# Patient Record
Sex: Female | Born: 1975 | Race: White | Hispanic: No | Smoking: Former smoker
Health system: Southern US, Community
[De-identification: ages and names within clinical notes are randomized; demographics above are authoritative.]

## PROBLEM LIST (undated history)

## (undated) DIAGNOSIS — I10 Essential (primary) hypertension: Secondary | ICD-10-CM

## (undated) DIAGNOSIS — E119 Type 2 diabetes mellitus without complications: Secondary | ICD-10-CM

## (undated) DIAGNOSIS — E785 Hyperlipidemia, unspecified: Secondary | ICD-10-CM

## (undated) DIAGNOSIS — J45909 Unspecified asthma, uncomplicated: Secondary | ICD-10-CM

## (undated) HISTORY — PX: CARPAL TUNNEL RELEASE: SHX101

## (undated) HISTORY — PX: APPENDECTOMY: SHX54

## (undated) HISTORY — PX: TONSILLECTOMY: SUR1361

## (undated) HISTORY — PX: TUBAL LIGATION: SHX77

---

## 2018-06-25 ENCOUNTER — Emergency Department (HOSPITAL_COMMUNITY): Payer: Medicaid - Out of State

## 2018-06-25 ENCOUNTER — Encounter (HOSPITAL_COMMUNITY): Payer: Self-pay | Admitting: Emergency Medicine

## 2018-06-25 ENCOUNTER — Emergency Department (HOSPITAL_COMMUNITY)
Admission: EM | Admit: 2018-06-25 | Discharge: 2018-06-25 | Disposition: A | Discharge status: Home / Self Care | Payer: Medicaid - Out of State | Attending: Emergency Medicine | Admitting: Emergency Medicine

## 2018-06-25 ENCOUNTER — Other Ambulatory Visit: Payer: Self-pay

## 2018-06-25 DIAGNOSIS — E119 Type 2 diabetes mellitus without complications: Secondary | ICD-10-CM | POA: Insufficient documentation

## 2018-06-25 DIAGNOSIS — I10 Essential (primary) hypertension: Secondary | ICD-10-CM | POA: Insufficient documentation

## 2018-06-25 DIAGNOSIS — Z91040 Latex allergy status: Secondary | ICD-10-CM | POA: Diagnosis not present

## 2018-06-25 DIAGNOSIS — M79642 Pain in left hand: Secondary | ICD-10-CM | POA: Diagnosis not present

## 2018-06-25 HISTORY — DX: Type 2 diabetes mellitus without complications: E11.9

## 2018-06-25 HISTORY — DX: Essential (primary) hypertension: I10

## 2018-06-25 MED ORDER — KETOROLAC TROMETHAMINE 30 MG/ML IJ SOLN
30.0000 mg | Freq: Once | INTRAMUSCULAR | Status: AC
  Administered 2018-06-25: 30 mg via INTRAMUSCULAR
  Filled 2018-06-25: qty 1

## 2018-06-25 MED ORDER — NAPROXEN 500 MG PO TABS: 500.0000 mg | ORAL_TABLET | Freq: Two times a day (BID) | ORAL | 0 refills | Status: AC

## 2018-06-25 NOTE — Discharge Instructions (Signed)
I have provided antiinflammatories to help with you hand swelling please take one tablet twice a day for the next 7 days. Please follow up with your PCP as needed.If you experience any fever, worsening symptoms or swelling worsen return to the ED for reevaluation.

## 2018-06-25 NOTE — ED Triage Notes (Signed)
Pt c/o LT hand pain and swelling since Thursday. Denies injury.

## 2018-06-25 NOTE — ED Notes (Signed)
EDP at bedside  

## 2018-06-25 NOTE — ED Provider Notes (Signed)
[facility] EMERGENCY DEPARTMENT Provider Note   CSN: [id number] Arrival date & time: 06/25/18  1058     History   Chief Complaint Chief Complaint  Patient presents with  . Hand Pain    HPI Emily Fritz is a 42 y.o. female.  42 y/o female with a PMH of DM, HTN presents to the ED with a chief complaint of left hand pain x 12 hours. Patient reports pain at the base of her wrist and finger. The pain is worse with hand flexion and extension. Patient has taken 800 mg ibuprofen for her pain but state no relieve in symptoms. She denies any trauma, fever, previous history of gout or other complaints.      Past Medical History:  Diagnosis Date  . Diabetes mellitus without complication (HCC)   . Hypertension     There are no active problems to display for this patient.   Past Surgical History:  Procedure Laterality Date  . APPENDECTOMY    . CARPAL TUNNEL RELEASE    . CESAREAN SECTION    . TONSILLECTOMY    . TUBAL LIGATION       OB History   None      Home Medications    Prior to Admission medications   Medication Sig Start Date End Date Taking? Authorizing Provider  naproxen (NAPROSYN) 500 MG tablet Take 1 tablet (500 mg total) by mouth 2 (two) times daily for 7 days. 06/25/18 07/02/18  Claude Manges, PA-C    Family History No family history on file.  Social History Social History   Tobacco Use  . Smoking status: Never Smoker  . Smokeless tobacco: Never Used  Substance Use Topics  . Alcohol use: Not Currently  . Drug use: Never     Allergies   Clindamycin/lincomycin; Amoxicillin; Latex; and Tramadol   Review of Systems Review of Systems  Constitutional: Negative for fever.  Musculoskeletal: Positive for arthralgias.  Skin: Negative for color change, pallor, rash and wound.     Physical Exam Updated Vital Signs BP (!) 126/59 (BP Location: Right Arm)   Pulse 81   Temp 97.9 F (36.6 C) (Oral)   Resp 16   Ht 5\' 3"  (1.6 m)   Wt 122.5 kg   LMP  02/08/2018   SpO2 99%   BMI 47.83 kg/m   Physical Exam  Constitutional: She is oriented to person, place, and time. She appears well-developed and well-nourished.  HENT:  Head: Normocephalic and atraumatic.  Neck: Normal range of motion. Neck supple.  Cardiovascular: Normal heart sounds.  Pulmonary/Chest: Breath sounds normal.  Abdominal: Soft.  Musculoskeletal: She exhibits tenderness. She exhibits no edema or deformity.       Right wrist: She exhibits tenderness and swelling. She exhibits no effusion, no crepitus, no deformity and no laceration.       Left hand: She exhibits tenderness. She exhibits no bony tenderness, normal capillary refill, no deformity, no laceration and no swelling. Normal sensation noted. Decreased sensation is not present in the ulnar distribution, is not present in the medial redistribution and is not present in the radial distribution. Normal strength noted. She exhibits no finger abduction, no thumb/finger opposition and no wrist extension trouble.       Hands: Pulses present, capillary refill intact. Neurologically intact.   Neurological: She is alert and oriented to person, place, and time.  Skin: Skin is warm and dry.  Nursing note and vitals reviewed.    ED Treatments / Results  Labs (all labs  ordered are listed, but only abnormal results are displayed) Labs Reviewed - No data to display  EKG None  Radiology Dg Hand Complete Left  Result Date: 06/25/2018 CLINICAL DATA:  42 year old female with left hand pain and swelling for 3 days but no known injury. EXAM: LEFT HAND - COMPLETE 3+ VIEW COMPARISON:  None. FINDINGS: Bone mineralization is within normal limits. There is no evidence of fracture or dislocation. There is no evidence of arthropathy or other focal bone abnormality. Appearance of generalized soft tissue swelling, no soft tissue gas or discrete soft tissue injury. IMPRESSION: Soft tissue swelling with no osseous abnormality identified.  Electronically Signed   By: Odessa Fleming M.D.   On: 06/25/2018 12:20    Procedures Procedures (including critical care time)  Medications Ordered in ED Medications  ketorolac (TORADOL) 30 MG/ML injection 30 mg (30 mg Intramuscular Given 06/25/18 1322)     Initial Impression / Assessment and Plan / ED Course  I have reviewed the triage vital signs and the nursing notes.  Pertinent labs & imaging results that were available during my care of the patient were reviewed by me and considered in my medical decision making (see chart for details).    Patient presents with left hand pain x 12 hours. DG left hand showed no fracture, dislocation. Soft tissue swelling noted.  Time I will provide patient with a wrist splint in order to help with her swelling.  She will also receive Toradol IM for her pain in the ED as patient is teary-eyed up on arrival.  I will send patient home with naproxen 500 mg twice a day and to follow-up with orthopedist if her symptoms do not improve.  Patient does not have a history of gout, and her pain is mostly on her hand distribution not the wrist joint.  She denies any fevers, less likely for a septic joint.  Patient is advised to follow-up with her PCP in 1 week for reevaluation of symptoms.  Patient understands and agrees with management.  Return precautions discussed.    Final Clinical Impressions(s) / ED Diagnoses   Final diagnoses:  Left hand pain    ED Discharge Orders         Ordered    naproxen (NAPROSYN) 500 MG tablet  2 times daily     06/25/18 1307           Claude Manges, PA-C 06/25/18 1334    Vanetta Mulders, MD 06/25/18 1740

## 2018-08-16 ENCOUNTER — Other Ambulatory Visit: Payer: Self-pay

## 2018-08-16 ENCOUNTER — Emergency Department (HOSPITAL_COMMUNITY): Payer: Medicaid - Out of State

## 2018-08-16 ENCOUNTER — Encounter (HOSPITAL_COMMUNITY): Payer: Self-pay | Admitting: Emergency Medicine

## 2018-08-16 ENCOUNTER — Emergency Department (HOSPITAL_COMMUNITY)
Admission: EM | Admit: 2018-08-16 | Discharge: 2018-08-16 | Disposition: A | Discharge status: Home / Self Care | Payer: Medicaid - Out of State | Attending: Emergency Medicine | Admitting: Emergency Medicine

## 2018-08-16 DIAGNOSIS — Z79899 Other long term (current) drug therapy: Secondary | ICD-10-CM | POA: Insufficient documentation

## 2018-08-16 DIAGNOSIS — I10 Essential (primary) hypertension: Secondary | ICD-10-CM | POA: Diagnosis not present

## 2018-08-16 DIAGNOSIS — J45901 Unspecified asthma with (acute) exacerbation: Secondary | ICD-10-CM | POA: Diagnosis not present

## 2018-08-16 DIAGNOSIS — E119 Type 2 diabetes mellitus without complications: Secondary | ICD-10-CM | POA: Diagnosis not present

## 2018-08-16 DIAGNOSIS — R062 Wheezing: Secondary | ICD-10-CM | POA: Diagnosis present

## 2018-08-16 HISTORY — DX: Unspecified asthma, uncomplicated: J45.909

## 2018-08-16 LAB — I-STAT CHEM 8, ED
BUN: 12 mg/dL (ref 6–20)
CHLORIDE: 103 mmol/L (ref 98–111)
CREATININE: 0.7 mg/dL (ref 0.44–1.00)
Calcium, Ion: 0.9 mmol/L — ABNORMAL LOW (ref 1.15–1.40)
GLUCOSE: 130 mg/dL — AB (ref 70–99)
HEMATOCRIT: 41 % (ref 36.0–46.0)
Hemoglobin: 13.9 g/dL (ref 12.0–15.0)
POTASSIUM: 4.3 mmol/L (ref 3.5–5.1)
Sodium: 135 mmol/L (ref 135–145)
TCO2: 29 mmol/L (ref 22–32)

## 2018-08-16 LAB — I-STAT BETA HCG BLOOD, ED (MC, WL, AP ONLY): I-stat hCG, quantitative: <5 m[IU]/mL (ref <5)

## 2018-08-16 LAB — CBG MONITORING, ED: GLUCOSE-CAPILLARY: 232 mg/dL — AB (ref 70–99)

## 2018-08-16 MED ORDER — PREDNISONE 20 MG PO TABS: 40.0000 mg | ORAL_TABLET | Freq: Every day | ORAL | 0 refills | Status: AC

## 2018-08-16 MED ORDER — SODIUM CHLORIDE 0.9 % IV BOLUS
1000.0000 mL | Freq: Once | INTRAVENOUS | Status: AC
  Administered 2018-08-16: 1000 mL via INTRAVENOUS

## 2018-08-16 MED ORDER — PREDNISONE 50 MG PO TABS
60.0000 mg | ORAL_TABLET | Freq: Once | ORAL | Status: AC
  Administered 2018-08-16: 60 mg via ORAL
  Filled 2018-08-16: qty 1

## 2018-08-16 MED ORDER — ALBUTEROL SULFATE (2.5 MG/3ML) 0.083% IN NEBU
2.5000 mg | INHALATION_SOLUTION | Freq: Once | RESPIRATORY_TRACT | Status: AC
  Administered 2018-08-16: 2.5 mg via RESPIRATORY_TRACT
  Filled 2018-08-16: qty 3

## 2018-08-16 MED ORDER — AZITHROMYCIN 250 MG PO TABS: ORAL_TABLET | ORAL | 0 refills | Status: AC

## 2018-08-16 MED ORDER — IPRATROPIUM-ALBUTEROL 0.5-2.5 (3) MG/3ML IN SOLN
3.0000 mL | Freq: Once | RESPIRATORY_TRACT | Status: AC
  Administered 2018-08-16: 3 mL via RESPIRATORY_TRACT
  Filled 2018-08-16: qty 3

## 2018-08-16 MED ORDER — ALBUTEROL (5 MG/ML) CONTINUOUS INHALATION SOLN
10.0000 mg/h | INHALATION_SOLUTION | Freq: Once | RESPIRATORY_TRACT | Status: AC
  Administered 2018-08-16: 10 mg/h via RESPIRATORY_TRACT
  Filled 2018-08-16: qty 20

## 2018-08-16 MED ORDER — ALBUTEROL SULFATE HFA 108 (90 BASE) MCG/ACT IN AERS
2.0000 | INHALATION_SPRAY | Freq: Once | RESPIRATORY_TRACT | Status: AC
  Administered 2018-08-16: 2 via RESPIRATORY_TRACT
  Filled 2018-08-16: qty 6.7

## 2018-08-16 NOTE — Discharge Instructions (Addendum)
Take the medicines as prescribed taking your next dose of the prednisone tomorrow morning.  Use your albuterol inhaler 2 puffs every 4 hours if you are coughing or wheezing.  Get rechecked immediately for any worsened symptoms.  Take the entire course of the antibiotic prescribed.  Make sure you are checking your blood glucose levels as prednisone can temporarily increase your blood glucose.

## 2018-08-16 NOTE — ED Triage Notes (Signed)
Pt c/o of productive cough with chills x 3 days.  Mild wheezing noted

## 2018-08-16 NOTE — ED Provider Notes (Signed)
[facility] EMERGENCY DEPARTMENT Provider Note   CSN: [id number] Arrival date & time: 08/16/18  9562     History   Chief Complaint Chief Complaint  Patient presents with  . Cough    HPI Emily Fritz is a 42 y.o. female with a history of asthma, diabetes and hypertension presenting with a 3-day history of wheezing, shortness of breath, cough which is been productive of clear sputum in association with posttussive emesis and chills with subjective fever.  She has had no upper respiratory symptoms including no nasal congestion, rhinorrhea, sore throat.  She also denies nausea or abdominal pain.  She denies chest pain but feels a tightness with her breathing.  She has had significant wheezing with shortness of breath.  Her last albuterol treatment per MDI was just prior to arrival which she states gives transient relief.  The history is provided by the patient.    Past Medical History:  Diagnosis Date  . Asthma   . Diabetes mellitus without complication (HCC)   . Hypertension     There are no active problems to display for this patient.   Past Surgical History:  Procedure Laterality Date  . APPENDECTOMY    . CARPAL TUNNEL RELEASE    . CESAREAN SECTION    . TONSILLECTOMY    . TUBAL LIGATION       OB History   No obstetric history on file.      Home Medications    Prior to Admission medications   Medication Sig Start Date End Date Taking? Authorizing Provider  Cyanocobalamin (VITAMIN B-12 CR) 1000 MCG TBCR Take 1 tablet by mouth daily. 07/31/18  Yes [provider]  ibuprofen (ADVIL,MOTRIN) 200 MG tablet Take 400 mg by mouth every 4 (four) hours as needed for fever or headache.    Yes [provider]  losartan (COZAAR) 50 MG tablet Take 1 tablet by mouth daily. 07/14/18  Yes [provider]  meloxicam (MOBIC) 15 MG tablet Take 1 tablet by mouth daily. 08/01/18  Yes [provider]  metFORMIN (GLUCOPHAGE-XR) 500 MG 24 hr tablet Take  2,000 mg by mouth daily. 05/26/18  Yes [provider]  azithromycin (ZITHROMAX Z-PAK) 250 MG tablet Take 2 tablets by mouth on day one followed by one tablet daily for 4 days. 08/16/18   Burgess Amor, PA-C  predniSONE (DELTASONE) 20 MG tablet Take 2 tablets (40 mg total) by mouth daily for 4 days. 08/16/18 08/20/18  Burgess Amor, PA-C    Family History History reviewed. No pertinent family history.  Social History Social History   Tobacco Use  . Smoking status: Never Smoker  . Smokeless tobacco: Never Used  Substance Use Topics  . Alcohol use: Not Currently  . Drug use: Never     Allergies   Clindamycin/lincomycin; Amoxicillin; Ciprofloxacin; Latex; and Tramadol   Review of Systems Review of Systems  Constitutional: Positive for chills and fever.  HENT: Negative for congestion and sore throat.   Eyes: Negative.   Respiratory: Positive for cough, shortness of breath and wheezing. Negative for chest tightness.   Cardiovascular: Negative for chest pain.  Gastrointestinal: Positive for vomiting. Negative for abdominal pain and nausea.       Posttussive emesis  Genitourinary: Negative.  Negative for decreased urine volume and dysuria.  Musculoskeletal: Negative for arthralgias, joint swelling and neck pain.  Skin: Negative.  Negative for rash and wound.  Neurological: Negative for dizziness, weakness, light-headedness, numbness and headaches.  Psychiatric/Behavioral: Negative.  Physical Exam Updated Vital Signs BP 120/80 (BP Location: Right Arm)   Pulse (!) 103   Temp 98.2 F (36.8 C) (Oral)   Resp 15   Ht 5\' 3"  (1.6 m)   Wt 122.5 kg   LMP 08/13/2018   SpO2 97%   BMI 47.83 kg/m   Physical Exam Vitals signs and nursing note reviewed.  Constitutional:      Appearance: She is well-developed.  HENT:     Head: Normocephalic and atraumatic.  Eyes:     Conjunctiva/sclera: Conjunctivae normal.  Neck:     Musculoskeletal: Normal range of motion.    Cardiovascular:     Rate and Rhythm: Normal rate and regular rhythm.     Heart sounds: Normal heart sounds.  Pulmonary:     Effort: Pulmonary effort is normal.     Breath sounds: Decreased breath sounds and wheezing present. No rhonchi.     Comments: Expiratory wheeze throughout all lung fields with decreased aeration.  Prolonged expirations. Abdominal:     General: Bowel sounds are normal.     Palpations: Abdomen is soft.     Tenderness: There is no abdominal tenderness.  Musculoskeletal: Normal range of motion.  Skin:    General: Skin is warm and dry.  Neurological:     Mental Status: She is alert.      ED Treatments / Results  Labs (all labs ordered are listed, but only abnormal results are displayed) Labs Reviewed  I-STAT CHEM 8, ED - Abnormal; Notable for the following components:      Result Value   Glucose, Bld 130 (*)    Calcium, Ion 0.90 (*)    All other components within normal limits  CBG MONITORING, ED - Abnormal; Notable for the following components:   Glucose-Capillary 232 (*)    All other components within normal limits  I-STAT BETA HCG BLOOD, ED ([facility], [facility], [facility] ONLY)    EKG None  Radiology Dg Chest 2 View  Result Date: 08/16/2018 CLINICAL DATA:  Shortness of breath with cough and wheezing EXAM: CHEST - 2 VIEW COMPARISON:  December 14, 2017 FINDINGS: No edema or consolidation. The heart size and pulmonary vascularity are normal. No adenopathy. No bone lesions. IMPRESSION: No edema or consolidation. Electronically Signed   By: Bretta BangWilliam  Woodruff III M.D.   On: 08/16/2018 10:07    Procedures Procedures (including critical care time)  Medications Ordered in ED Medications  ipratropium-albuterol (DUONEB) 0.5-2.5 (3) MG/3ML nebulizer solution 3 mL (3 mLs Nebulization Given 08/16/18 1010)  albuterol (PROVENTIL) (2.5 MG/3ML) 0.083% nebulizer solution 2.5 mg (2.5 mg Nebulization Given 08/16/18 1010)  predniSONE (DELTASONE) tablet 60 mg (60 mg Oral Given 08/16/18  1026)  albuterol (PROVENTIL,VENTOLIN) solution continuous neb (10 mg/hr Nebulization Given 08/16/18 1230)  sodium chloride 0.9 % bolus 1,000 mL (0 mLs Intravenous Stopped 08/16/18 1355)  albuterol (PROVENTIL HFA;VENTOLIN HFA) 108 (90 Base) MCG/ACT inhaler 2 puff (2 puffs Inhalation Given 08/16/18 1543)     Initial Impression / Assessment and Plan / ED Course  I have reviewed the triage vital signs and the nursing notes.  Pertinent labs & imaging results that were available during my care of the patient were reviewed by me and considered in my medical decision making (see chart for details).     Pt given albuterol atrovent neb tx, prednisone PO with no sig improvement in wheezing or sob.  She was next given albuterol continuous neb over 1 hour with improvement in wheezing, sob greatly improved with improvement in  aeration throughout all lung fields.  She was given an albuterol mdi, stating the mdi she used this am was borrowed as she cannot find hers since a recent move.  Also placed on a pulse dose prednisone taper, caution re blood glucose checks. zithromax started given asthma hx and chills/fever. Return precautions or recheck by pcp discussed if sx persist or worsen.  Final Clinical Impressions(s) / ED Diagnoses   Final diagnoses:  Moderate asthma with exacerbation, unspecified whether persistent    ED Discharge Orders         Ordered    predniSONE (DELTASONE) 20 MG tablet  Daily     08/16/18 1452    azithromycin (ZITHROMAX Z-PAK) 250 MG tablet     08/16/18 1452           Burgess Amor, PA-C 08/16/18 1701    Loren Racer, MD 08/18/18 1101

## 2018-08-16 NOTE — ED Notes (Signed)
Respiratory called about breathing tx

## 2019-03-20 IMAGING — DX DG HAND COMPLETE 3+V*L*
3 series · 3 of 3 positions shown · non-contrast
Comparison: None.

CLINICAL DATA: 42-year-old female with left hand pain and swelling
for 3 days but no known injury.

EXAM:
LEFT HAND - COMPLETE 3+ VIEW

[hand pa]
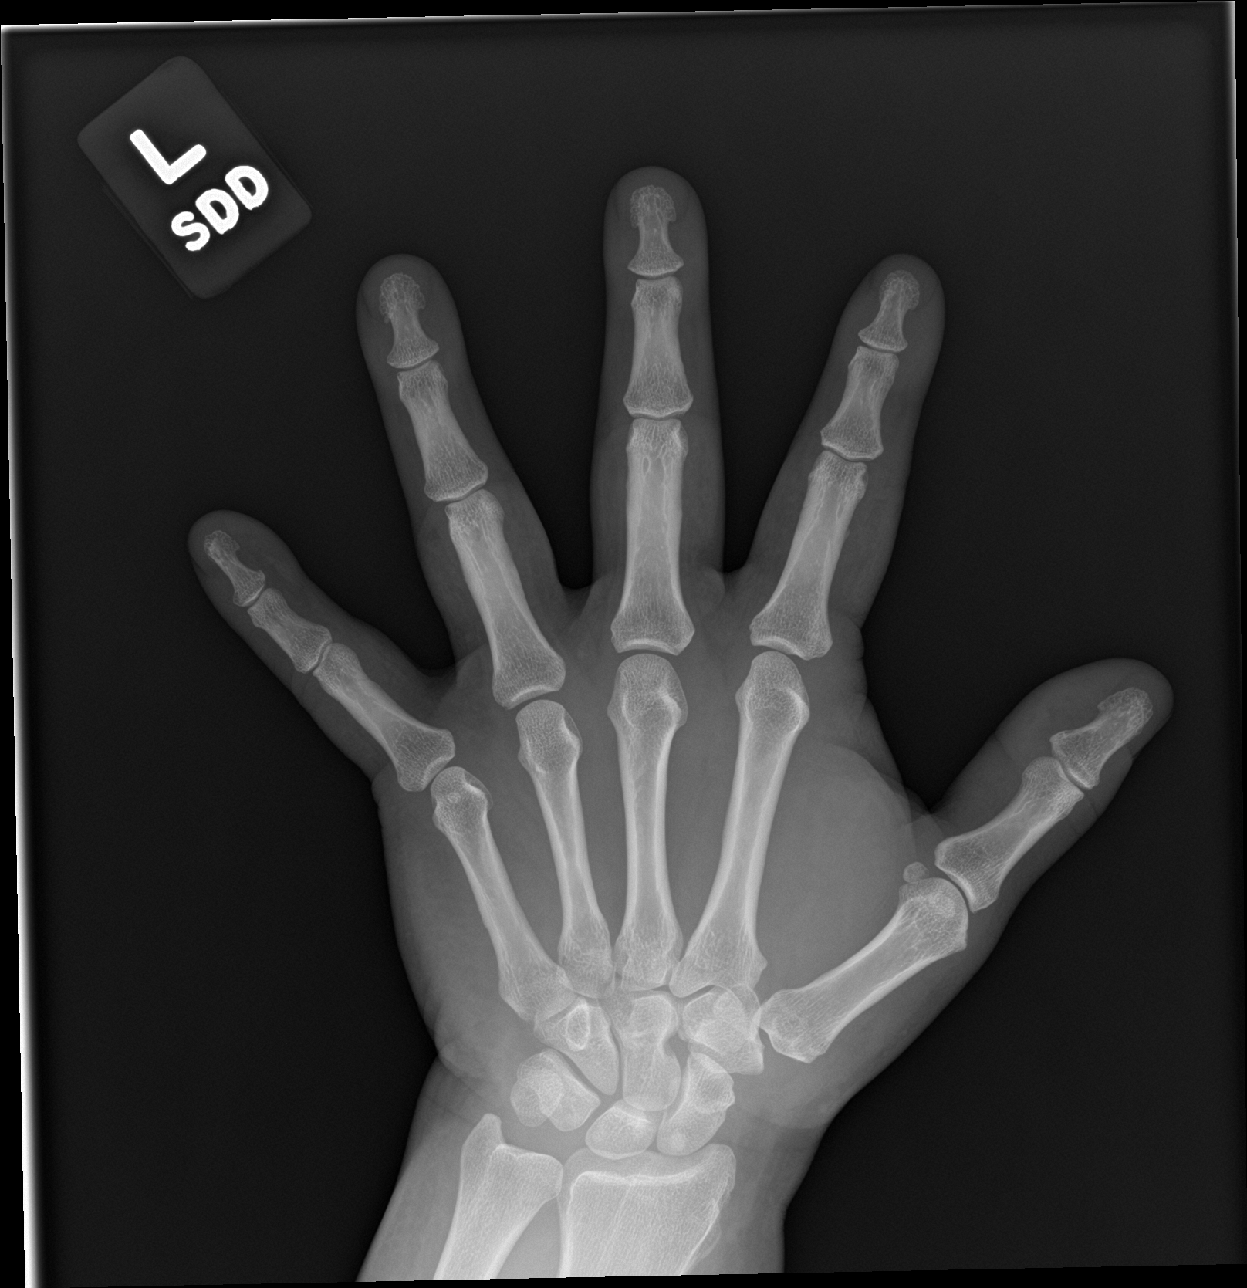

[hand obl]
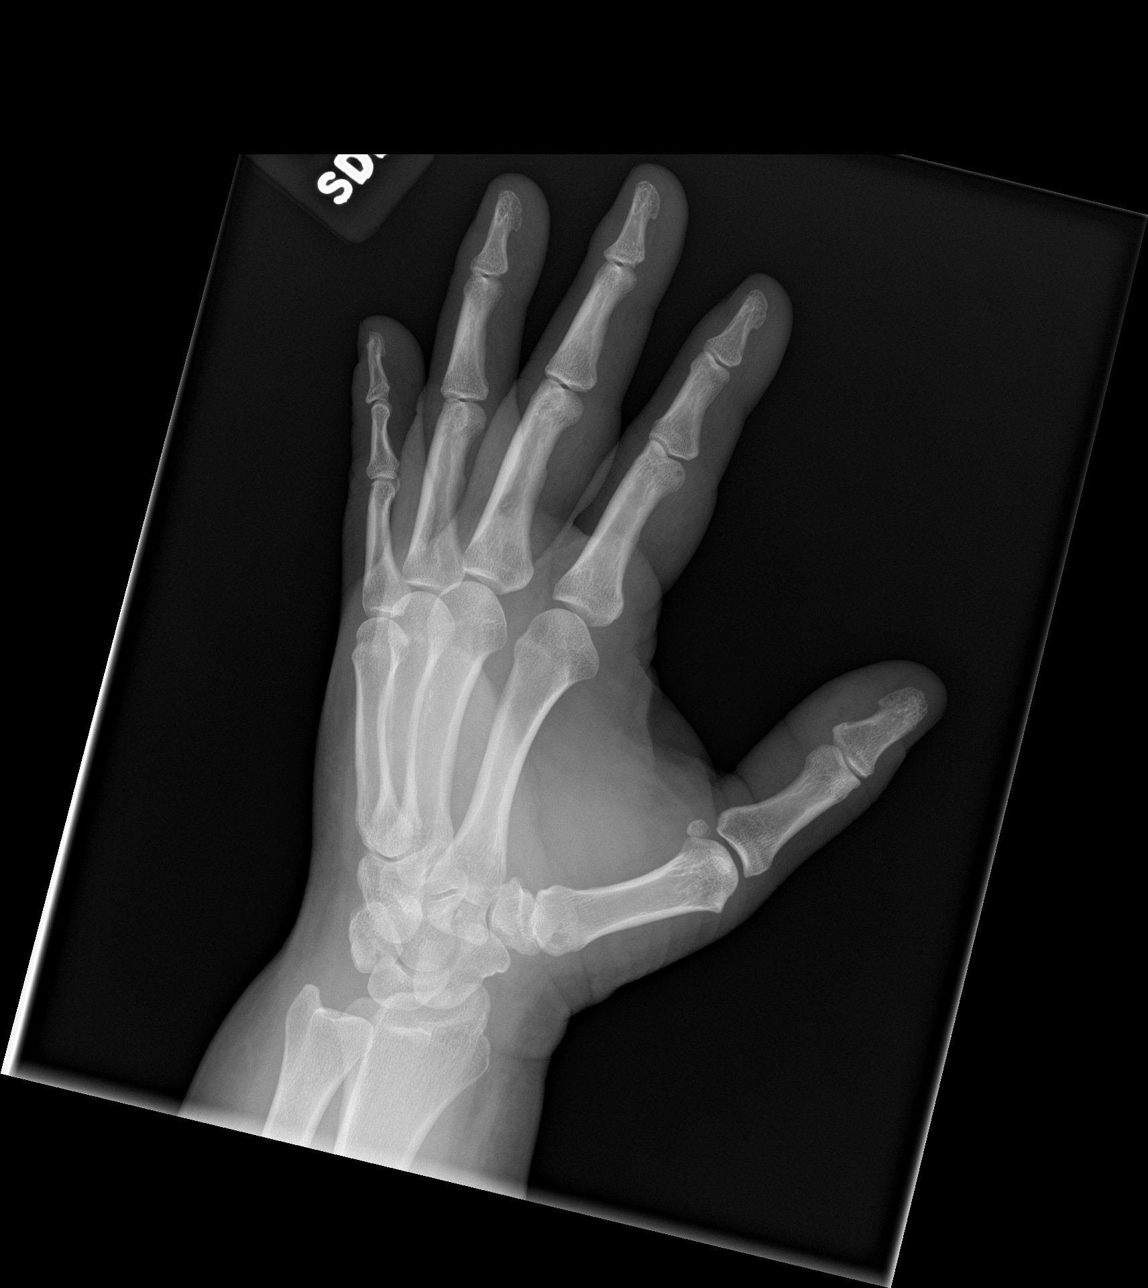

[hand lat]
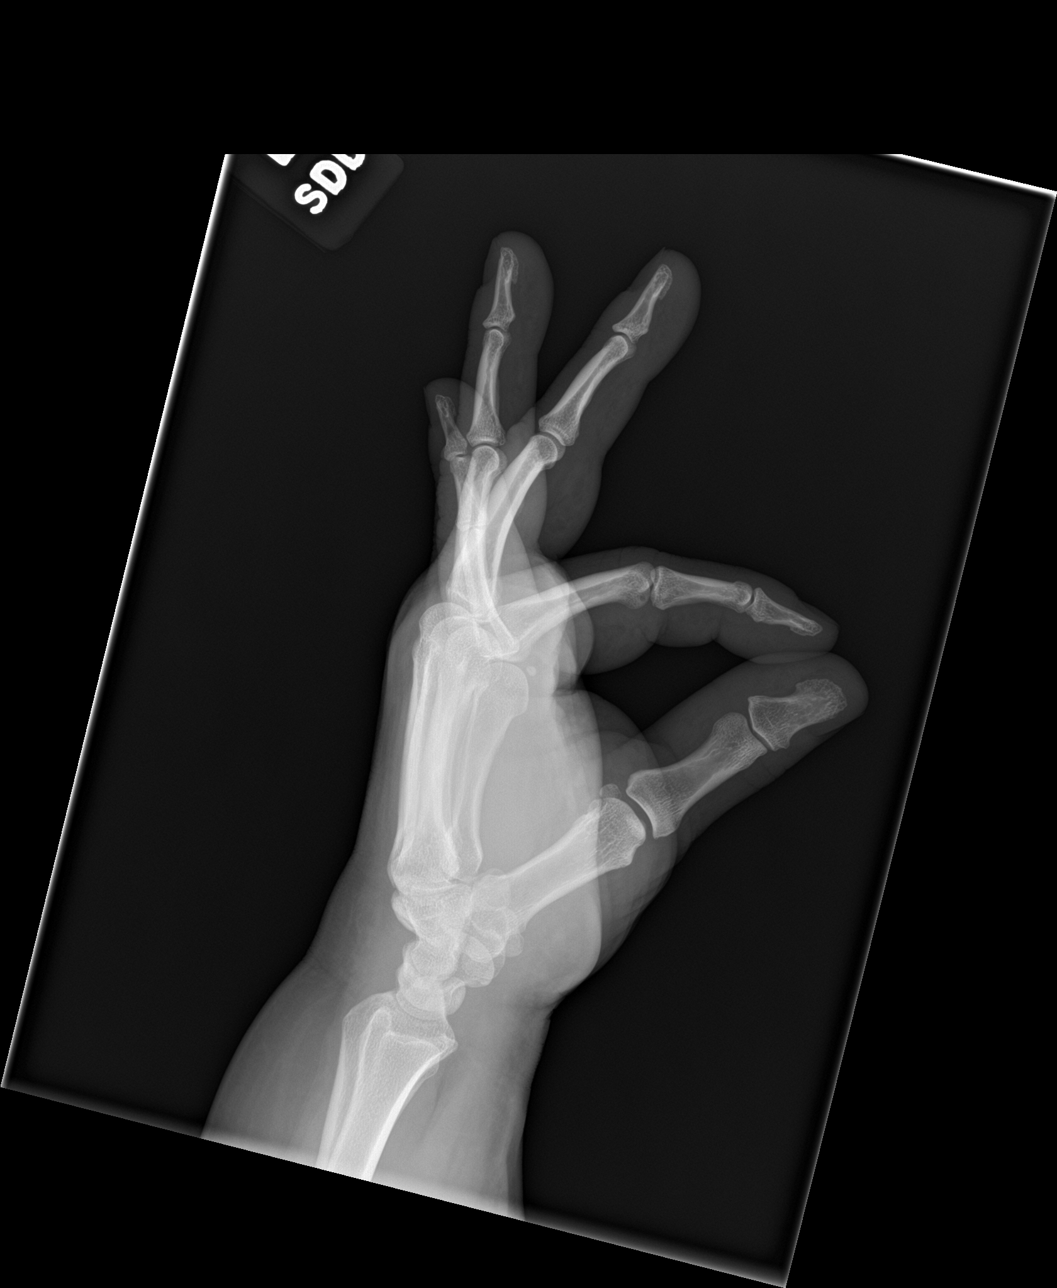

[3 of 3 positions shown; findings below may reference images not displayed]

FINDINGS: Bone mineralization is within normal limits. There is no evidence of
fracture or dislocation. There is no evidence of arthropathy or
other focal bone abnormality. Appearance of generalized soft tissue
swelling, no soft tissue gas or discrete soft tissue injury.
IMPRESSION: Soft tissue swelling with no osseous abnormality identified.

## 2021-08-29 ENCOUNTER — Emergency Department (HOSPITAL_COMMUNITY)
Admission: EM | Admit: 2021-08-29 | Discharge: 2021-08-29 | Disposition: A | Discharge status: Home / Self Care | Payer: Medicaid - Out of State | Attending: Emergency Medicine | Admitting: Emergency Medicine

## 2021-08-29 ENCOUNTER — Encounter (HOSPITAL_COMMUNITY): Payer: Self-pay

## 2021-08-29 ENCOUNTER — Other Ambulatory Visit: Payer: Self-pay

## 2021-08-29 ENCOUNTER — Emergency Department (HOSPITAL_COMMUNITY): Payer: Medicaid - Out of State

## 2021-08-29 DIAGNOSIS — Z91040 Latex allergy status: Secondary | ICD-10-CM | POA: Diagnosis not present

## 2021-08-29 DIAGNOSIS — U071 COVID-19: Secondary | ICD-10-CM | POA: Insufficient documentation

## 2021-08-29 DIAGNOSIS — Z79899 Other long term (current) drug therapy: Secondary | ICD-10-CM | POA: Diagnosis not present

## 2021-08-29 DIAGNOSIS — J45909 Unspecified asthma, uncomplicated: Secondary | ICD-10-CM | POA: Diagnosis not present

## 2021-08-29 DIAGNOSIS — I10 Essential (primary) hypertension: Secondary | ICD-10-CM | POA: Insufficient documentation

## 2021-08-29 DIAGNOSIS — E119 Type 2 diabetes mellitus without complications: Secondary | ICD-10-CM | POA: Diagnosis not present

## 2021-08-29 DIAGNOSIS — Z87891 Personal history of nicotine dependence: Secondary | ICD-10-CM | POA: Diagnosis not present

## 2021-08-29 DIAGNOSIS — Z7984 Long term (current) use of oral hypoglycemic drugs: Secondary | ICD-10-CM | POA: Insufficient documentation

## 2021-08-29 DIAGNOSIS — R059 Cough, unspecified: Secondary | ICD-10-CM | POA: Diagnosis present

## 2021-08-29 LAB — RESP PANEL BY RT-PCR (FLU A&B, COVID) ARPGX2
Influenza A by PCR: NEGATIVE
Influenza B by PCR: NEGATIVE
SARS Coronavirus 2 by RT PCR: POSITIVE — AB

## 2021-08-29 MED ORDER — METHYLPREDNISOLONE SODIUM SUCC 125 MG IJ SOLR
125.0000 mg | Freq: Once | INTRAMUSCULAR | Status: AC
  Administered 2021-08-29: 125 mg via INTRAMUSCULAR
  Filled 2021-08-29: qty 2

## 2021-08-29 MED ORDER — ALBUTEROL (5 MG/ML) CONTINUOUS INHALATION SOLN: 10.0000 mg/h | INHALATION_SOLUTION | Freq: Once | RESPIRATORY_TRACT | Status: DC

## 2021-08-29 MED ORDER — ALBUTEROL (5 MG/ML) CONTINUOUS INHALATION SOLN: 5.0000 mg/h | INHALATION_SOLUTION | Freq: Once | RESPIRATORY_TRACT | Status: DC

## 2021-08-29 MED ORDER — ALBUTEROL SULFATE (2.5 MG/3ML) 0.083% IN NEBU
INHALATION_SOLUTION | RESPIRATORY_TRACT | Status: AC
  Administered 2021-08-29: 10 mg
  Filled 2021-08-29: qty 12

## 2021-08-29 MED ORDER — MOLNUPIRAVIR EUA 200MG CAPSULE: 4.0000 | ORAL_CAPSULE | Freq: Two times a day (BID) | ORAL | 0 refills | Status: AC

## 2021-08-29 MED ORDER — ALBUTEROL SULFATE (2.5 MG/3ML) 0.083% IN NEBU
INHALATION_SOLUTION | RESPIRATORY_TRACT | Status: AC
  Administered 2021-08-29: 5 mg
  Filled 2021-08-29: qty 3

## 2021-08-29 MED ORDER — PREDNISONE 10 MG PO TABS: 30.0000 mg | ORAL_TABLET | Freq: Every day | ORAL | 0 refills | Status: AC

## 2021-08-29 NOTE — ED Triage Notes (Signed)
Pt presents to ED with complaints of non productive cough, wheezing started 2 days ago. Pt last used albuterol inhaler this morning.

## 2021-08-29 NOTE — ED Notes (Signed)
Pt reports some relief in cough / wheezing following neb treatment

## 2021-08-29 NOTE — Discharge Instructions (Signed)
You have COVID, started on antiviral treatment as well as steroids take as prescribed.  Please continue use your inhaler as needed for chest tightness.  Recommend over-the-counter pain medications like ibuprofen Tylenol for fever and pain control, nasal decongestions like Flonase and Zyrtec, Mucinex for cough.  If not eating recommend supplementing with Gatorade to help with electrolyte supplementation.  Recommend warm moist air i.e. humidifiers as well up with your breathing.  You must self quarantine for 5 days starting on symptom onset, on day 6 you may return back to work but must wear mask for additional 5 more days.  Follow-up PCP as needed.  Come back to the emergency department if you develop chest pain, shortness of breath, severe abdominal pain, uncontrolled nausea, vomiting, diarrhea.   Follow-up PCP for further evaluation.  Come back to the emergency department if you develop chest pain, shortness of breath, severe abdominal pain, uncontrolled nausea, vomiting, diarrhea.

## 2021-08-29 NOTE — ED Provider Notes (Signed)
[facility] EMERGENCY DEPARTMENT Provider Note   CSN: [id number] Arrival date & time: 08/29/21  0211     History Chief Complaint  Patient presents with   Cough    Emily Fritz is a 45 y.o. female.  HPI  Patient with medical history including asthma, diabetes, hypertension presents with complaints of URI-like symptoms going on for last 2 days time.  She endorses nasal congestion, nonproductive cough, chest tightness and fatigue.  She denies stomach pains, nausea, vomit, diarrhea, general body aches, she denies pleuritic chest pain, shortness of breath, states that she been taking albuterol without much relief, she is vaccinated COVID but has not gotten her flu vaccine, she denies any alleviating.  She denies any recent sick contacts, she is not had recent antibiotics or steroid use, patient states she still tolerating p.o., she denies stomach pain, nausea, vomiting, constipation diarrhea.  Past Medical History:  Diagnosis Date   Asthma    Diabetes mellitus without complication (HCC)    Hypertension     There are no problems to display for this patient.   Past Surgical History:  Procedure Laterality Date   APPENDECTOMY     CARPAL TUNNEL RELEASE     CESAREAN SECTION     TONSILLECTOMY     TUBAL LIGATION       OB History   No obstetric history on file.     No family history on file.  Social History   Tobacco Use   Smoking status: Former    Packs/day: 0.50    Types: Cigarettes   Smokeless tobacco: Never  Vaping Use   Vaping Use: Never used  Substance Use Topics   Alcohol use: Not Currently   Drug use: Never    Home Medications Prior to Admission medications   Medication Sig Start Date End Date Taking? Authorizing Provider  Cyanocobalamin (VITAMIN B-12 CR) 1000 MCG TBCR Take 1 tablet by mouth daily. 07/31/18  Yes [provider]  D3 MAXIMUM STRENGTH 125 MCG (5000 UT) capsule Take 5,000 Units by mouth daily. 07/20/21  Yes [provider]   JARDIANCE 25 MG TABS tablet Take 25 mg by mouth every morning. 08/25/21  Yes [provider]  molnupiravir EUA (LAGEVRIO) 200 mg CAPS capsule Take 4 capsules (800 mg total) by mouth 2 (two) times daily for 5 days. 08/29/21 09/03/21 Yes Carroll Sage, PA-C  predniSONE (DELTASONE) 10 MG tablet Take 3 tablets (30 mg total) by mouth daily for 5 days. 08/29/21 09/03/21 Yes Carroll Sage, PA-C  rosuvastatin (CRESTOR) 5 MG tablet Take 5 mg by mouth daily. 05/10/21  Yes [provider]  sertraline (ZOLOFT) 25 MG tablet Take 25 mg by mouth daily. 05/10/21  Yes [provider]  SOLIQUA 100-33 UNT-MCG/ML SOPN Inject 30 mLs into the skin at bedtime. 08/25/21  Yes [provider]  Vitamin D, Ergocalciferol, (DRISDOL) 1.25 MG (50000 UNIT) CAPS capsule Take 50,000 Units by mouth every 7 (seven) days. 08/25/21  Yes [provider]  azithromycin (ZITHROMAX Z-PAK) 250 MG tablet Take 2 tablets by mouth on day one followed by one tablet daily for 4 days. Patient not taking: Reported on 08/29/2021 08/16/18   Burgess Amor, PA-C  ibuprofen (ADVIL,MOTRIN) 200 MG tablet Take 400 mg by mouth every 4 (four) hours as needed for fever or headache.  Patient not taking: Reported on 08/29/2021    [provider]  losartan (COZAAR) 50 MG tablet Take 1 tablet by mouth daily. Patient not taking: Reported on 08/29/2021 07/14/18  [provider]  meloxicam (MOBIC) 15 MG tablet Take 1 tablet by mouth daily. Patient not taking: Reported on 08/29/2021 08/01/18   [provider]  metFORMIN (GLUCOPHAGE-XR) 500 MG 24 hr tablet Take 2,000 mg by mouth daily. Patient not taking: Reported on 08/29/2021 05/26/18   [provider]    Allergies    Clindamycin/lincomycin, Amoxicillin, Ciprofloxacin, Latex, and Tramadol  Review of Systems   Review of Systems  Constitutional:  Positive for fatigue. Negative for chills and fever.  HENT:  Positive for  congestion. Negative for sore throat.   Respiratory:  Positive for cough and chest tightness. Negative for shortness of breath.   Cardiovascular:  Negative for chest pain.  Gastrointestinal:  Negative for abdominal pain, nausea and vomiting.  Genitourinary:  Negative for enuresis.  Musculoskeletal:  Negative for back pain.  Skin:  Negative for rash.  Neurological:  Negative for dizziness and headaches.  Hematological:  Does not bruise/bleed easily.   Physical Exam Updated Vital Signs BP (!) 142/85    Pulse (!) 117    Temp 97.7 F (36.5 C) (Oral)    Resp 18    Ht 5\' 3"  (1.6 m)    Wt 127 kg    LMP 08/13/2018    SpO2 100%    BMI 49.60 kg/m   Physical Exam Vitals and nursing note reviewed.  Constitutional:      General: She is not in acute distress.    Appearance: She is not ill-appearing.  HENT:     Head: Normocephalic and atraumatic.     Right Ear: Tympanic membrane normal.     Left Ear: Tympanic membrane normal.     Nose: No congestion.     Mouth/Throat:     Mouth: Mucous membranes are moist.     Pharynx: No oropharyngeal exudate or posterior oropharyngeal erythema.     Comments: Cobblestoning on the posterior pharynx. Eyes:     Conjunctiva/sclera: Conjunctivae normal.  Cardiovascular:     Rate and Rhythm: Normal rate and regular rhythm.     Pulses: Normal pulses.     Heart sounds: No murmur heard.   No friction rub. No gallop.  Pulmonary:     Effort: No respiratory distress.     Breath sounds: Wheezing present. No rhonchi or rales.     Comments: No evidence of pressure distress present my exam, nontachypneic, nonhypoxic, able speak in full sentences, slightly tight sounding chest, with expiratory wheezing heard bilaterally, no rales rhonchi or stridor noted. Abdominal:     Palpations: Abdomen is soft.     Tenderness: There is no right CVA tenderness or left CVA tenderness.  Musculoskeletal:     Right lower leg: No edema.     Left lower leg: No edema.  Skin:    General:  Skin is warm and dry.  Neurological:     Mental Status: She is alert.  Psychiatric:        Mood and Affect: Mood normal.    ED Results / Procedures / Treatments   Labs (all labs ordered are listed, but only abnormal results are displayed) Labs Reviewed  RESP PANEL BY RT-PCR (FLU A&B, COVID) ARPGX2 - Abnormal; Notable for the following components:      Result Value   SARS Coronavirus 2 by RT PCR POSITIVE (*)    All other components within normal limits    EKG None  Radiology DG Chest Port 1 View  Result Date: 08/29/2021 CLINICAL DATA:  Cough and wheezing for 2  days. EXAM: PORTABLE CHEST 1 VIEW COMPARISON:  August 16, 2018 FINDINGS: The heart size and mediastinal contours are within normal limits. Both lungs are clear. The visualized skeletal structures are stable. IMPRESSION: No active disease. Electronically Signed   By: Sherian Rein M.D.   On: 08/29/2021 10:53    Procedures Procedures   Medications Ordered in ED Medications  albuterol (PROVENTIL,VENTOLIN) solution continuous neb (10 mg/hr Nebulization Not Given 08/29/21 1206)  methylPREDNISolone sodium succinate (SOLU-MEDROL) 125 mg/2 mL injection 125 mg (125 mg Intramuscular Given 08/29/21 1101)  albuterol (PROVENTIL) (2.5 MG/3ML) 0.083% nebulizer solution (5 mg  Given 08/29/21 1014)  albuterol (PROVENTIL) (2.5 MG/3ML) 0.083% nebulizer solution (10 mg  Given 08/29/21 1205)    ED Course  I have reviewed the triage vital signs and the nursing notes.  Pertinent labs & imaging results that were available during my care of the patient were reviewed by me and considered in my medical decision making (see chart for details).    MDM Rules/Calculators/A&P                         Initial impression-presents with URI-like symptoms.  Alert, no acute stress, vital signs reassuring.  Likely patient is having a viral bronchitis but want to rule out pneumonia.  Will obtain respiratory panel, chest x-ray prior with Solu-Medrol and  albuterol and reassess.  Work-up-chest ray is unremarkable.  Respiratory panel positive for COVID  Reassessment-reassessed after first nebulizer treatment states she feels slightly better but still has a tight sign chest and wheezing present we will provide her with a continuous neb.  Reassess  Reassessed after second nebulizer treatment wheezing is completely resolved, patient states he feels better, vital signs remained stable, patient treated for discharge.  Rule out- Low suspicion for systemic infection as patient is nontoxic-appearing, vital signs reassuring, no obvious source infection noted on exam.  Low suspicion for pneumonia as  x-ray did not reveal any acute findings.  I have low suspicion for PE as patient denies pleuritic chest pain, shortness of breath, vital signs reassuring, nontachypneic, nonhypoxic, she has noted be tachycardic but I suspect this is multifactorial albuterol use as well as testing positive for COVID. low suspicion for strep throat as oropharynx was visualized, no erythema or exudates noted.  Low suspicion patient would need  hospitalized due to viral infection or Covid as vital signs reassuring, patient is not in respiratory distress.     Plan-  COVID-due to patient's increased BMI and moderate symptoms (wheezing and tight chest ) will start her on antiviral treatment, steroids, recommend symptom management follow-up with PCP as needed.  Gave strict return precautions.  Vital signs have remained stable, no indication for hospital admission.  Patient given at home care as well strict return precautions.  Patient verbalized that they understood agreed to said plan.     Final Clinical Impression(s) / ED Diagnoses Final diagnoses:  COVID    Rx / DC Orders ED Discharge Orders          Ordered    molnupiravir EUA (LAGEVRIO) 200 mg CAPS capsule  2 times daily        08/29/21 1346    predniSONE (DELTASONE) 10 MG tablet  Daily        08/29/21 1346              Barnie Del 08/29/21 1348    Bethann Berkshire, MD 09/01/21 1040

## 2022-03-29 ENCOUNTER — Emergency Department (HOSPITAL_COMMUNITY)
Admission: EM | Admit: 2022-03-29 | Discharge: 2022-03-30 | Disposition: A | Discharge status: Home / Self Care | Payer: Medicaid - Out of State | Attending: Emergency Medicine | Admitting: Emergency Medicine

## 2022-03-29 ENCOUNTER — Encounter (HOSPITAL_COMMUNITY): Payer: Self-pay | Admitting: Emergency Medicine

## 2022-03-29 ENCOUNTER — Other Ambulatory Visit: Payer: Self-pay

## 2022-03-29 DIAGNOSIS — Z91040 Latex allergy status: Secondary | ICD-10-CM | POA: Diagnosis not present

## 2022-03-29 DIAGNOSIS — S3992XA Unspecified injury of lower back, initial encounter: Secondary | ICD-10-CM | POA: Diagnosis present

## 2022-03-29 DIAGNOSIS — S39012A Strain of muscle, fascia and tendon of lower back, initial encounter: Secondary | ICD-10-CM | POA: Diagnosis not present

## 2022-03-29 DIAGNOSIS — Y92410 Unspecified street and highway as the place of occurrence of the external cause: Secondary | ICD-10-CM | POA: Diagnosis not present

## 2022-03-29 HISTORY — DX: Hyperlipidemia, unspecified: E78.5
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
## 2022-03-30 MED ORDER — IBUPROFEN 800 MG PO TABS: 800.0000 mg | ORAL_TABLET | Freq: Four times a day (QID) | ORAL | 0 refills | Status: AC | PRN

## 2022-03-30 MED ORDER — METHOCARBAMOL 500 MG PO TABS: 500.0000 mg | ORAL_TABLET | Freq: Three times a day (TID) | ORAL | 0 refills | Status: AC | PRN

## 2022-03-30 MED ORDER — ACETAMINOPHEN 500 MG PO TABS
1000.0000 mg | ORAL_TABLET | Freq: Once | ORAL | Status: AC
  Administered 2022-03-30: 1000 mg via ORAL
  Filled 2022-03-30: qty 2

## 2022-03-30 MED ORDER — IBUPROFEN 800 MG PO TABS
800.0000 mg | ORAL_TABLET | Freq: Once | ORAL | Status: AC
  Administered 2022-03-30: 800 mg via ORAL
  Filled 2022-03-30: qty 1

## 2022-03-30 MED ORDER — METHOCARBAMOL 500 MG PO TABS
500.0000 mg | ORAL_TABLET | Freq: Once | ORAL | Status: AC
  Administered 2022-03-30: 500 mg via ORAL
  Filled 2022-03-30: qty 1
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
## 2022-05-24 IMAGING — DX DG CHEST 1V PORT
1 series · 1 of 1 positions shown · non-contrast
Comparison: August 16, 2018

CLINICAL DATA: Cough and wheezing for 2 days.

EXAM:
PORTABLE CHEST 1 VIEW

[chest ap]
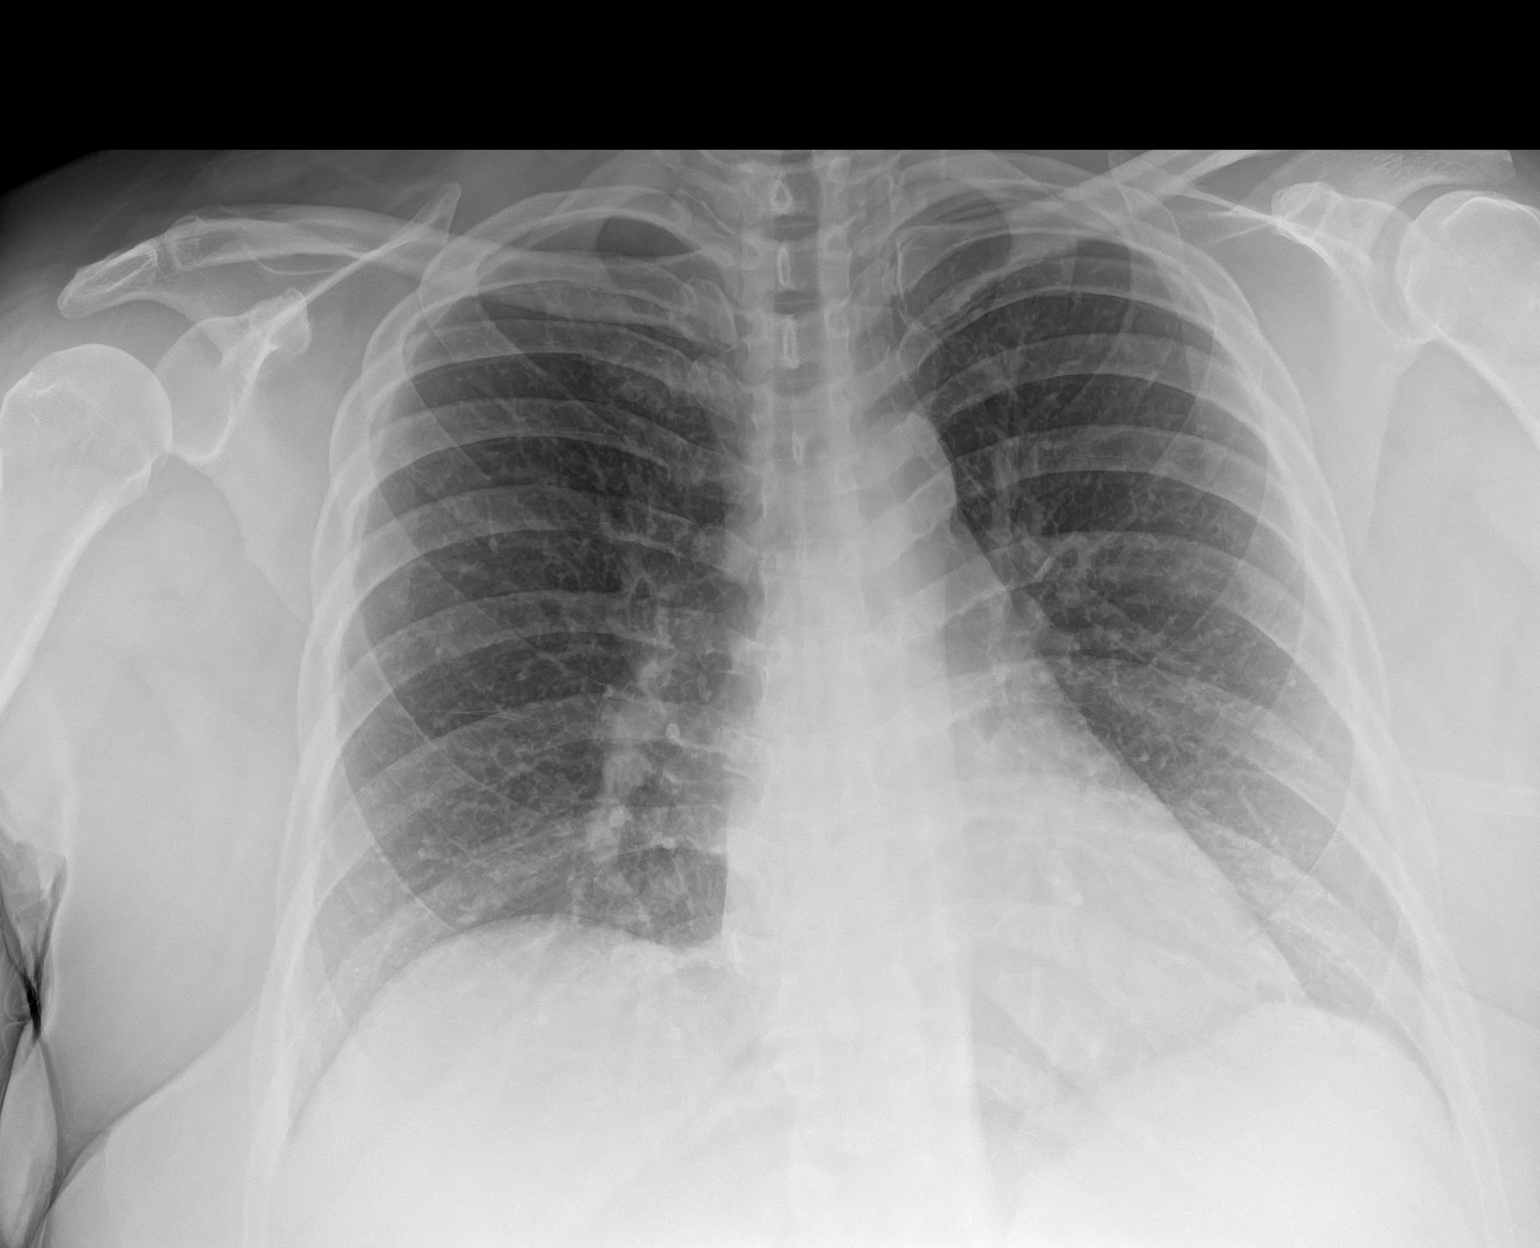

[1 of 1 positions shown; findings below may reference images not displayed]

FINDINGS: The heart size and mediastinal contours are within normal limits.
Both lungs are clear. The visualized skeletal structures are stable.
IMPRESSION: No active disease.
